# Patient Record
Sex: Male | Born: 1955 | Race: Black or African American | Hispanic: No | Marital: Single | Smoking: Never smoker
Health system: Southern US, Community
[De-identification: ages and names within clinical notes are randomized; demographics above are authoritative.]

## PROBLEM LIST (undated history)

## (undated) DIAGNOSIS — E119 Type 2 diabetes mellitus without complications: Secondary | ICD-10-CM

## (undated) DIAGNOSIS — I10 Essential (primary) hypertension: Secondary | ICD-10-CM

## (undated) HISTORY — PX: OTHER SURGICAL HISTORY: SHX169

---

## 2005-06-07 ENCOUNTER — Emergency Department (HOSPITAL_COMMUNITY): Admission: EM | Admit: 2005-06-07 | Discharge: 2005-06-07 | Payer: Self-pay | Admitting: Emergency Medicine

## 2007-10-06 ENCOUNTER — Emergency Department (HOSPITAL_COMMUNITY): Admission: EM | Admit: 2007-10-06 | Discharge: 2007-10-06 | Payer: Self-pay | Admitting: Emergency Medicine

## 2010-10-07 LAB — CBC
Hemoglobin: 14.4
MCHC: 32.1
Platelets: 149 — ABNORMAL LOW
RDW: 15.8 — ABNORMAL HIGH

## 2010-10-07 LAB — BASIC METABOLIC PANEL
BUN: 8
CO2: 25
Calcium: 8.8
Creatinine, Ser: 1.08
GFR calc non Af Amer: >60
Glucose, Bld: 121 — ABNORMAL HIGH
Sodium: 135

## 2010-10-07 LAB — DIFFERENTIAL
Basophils Absolute: 0
Basophils Relative: 0
Monocytes Absolute: 0.5
Neutro Abs: 15.7 — ABNORMAL HIGH

## 2010-10-07 LAB — RAPID STREP SCREEN (MED CTR MEBANE ONLY): Streptococcus, Group A Screen (Direct): POSITIVE — AB

## 2011-03-24 ENCOUNTER — Encounter: Payer: Self-pay | Admitting: Gastroenterology

## 2011-03-24 ENCOUNTER — Ambulatory Visit (INDEPENDENT_AMBULATORY_CARE_PROVIDER_SITE_OTHER): Payer: Managed Care, Other (non HMO) | Admitting: Gastroenterology

## 2011-03-24 DIAGNOSIS — R1310 Dysphagia, unspecified: Secondary | ICD-10-CM

## 2011-03-24 DIAGNOSIS — K529 Noninfective gastroenteritis and colitis, unspecified: Secondary | ICD-10-CM | POA: Insufficient documentation

## 2011-03-24 DIAGNOSIS — E860 Dehydration: Secondary | ICD-10-CM | POA: Insufficient documentation

## 2011-03-24 DIAGNOSIS — K5289 Other specified noninfective gastroenteritis and colitis: Secondary | ICD-10-CM

## 2011-03-24 NOTE — Patient Instructions (Signed)
We have set you up for an upper endoscopy and colonoscopy to check out your GI tract. Start taking the Prilosec daily, 30 minutes before the first meal of the day. We have provided samples for now.   Please have your blood work repeated on Thursday or Friday of this week. We will send the results to Dr. Renard Matter.   Further recommendations to follow once these procedures are completed.

## 2011-03-24 NOTE — Progress Notes (Signed)
  Referring Provider: Dr. Renard Matter Primary Care Physician:  Alice Reichert, MD, MD Primary Gastroenterologist:  Dr. Jena Gauss   Chief Complaint  Patient presents with  . Dysphagia    HPI:   Melvin Carr is a 56 year old male referred by Dr. Renard Matter secondary to N/V/D. Last Monday, pt experienced acute onset of N/V/D, all of which have resolved now. Was seen in PCP's office last Friday, with BMP showing very mild elevation of BUN/Cr (27 and 1.5). Dr. Renard Matter aware.  Pt reports dysphagia, present prior to acute illness for several weeks. +reflux lasting about 3 days, now improved. Denies abdominal pain. No rectal bleeding. Bowel habits normalized. Does note very small, streaky amount of blood in emesis towards end of week. Notes last week +fever/chills/achy, resolved now.   No prior EGD or TCS.   Past Medical History  Diagnosis Date  . Medical history non-contributory     Past Surgical History  Procedure Date  . None     No current outpatient prescriptions on file.    Allergies as of 03/24/2011  . (No Known Allergies)    Family History  Problem Relation Age of Onset  . Colon cancer Neg Hx     History   Social History  . Marital Status: Single    Spouse Name: N/A    Number of Children: N/A  . Years of Education: N/A   Occupational History  . Truck driver    Social History Main Topics  . Smoking status: Never Smoker   . Smokeless tobacco: Not on file  . Alcohol Use: No  . Drug Use: No  . Sexually Active: Not on file   Other Topics Concern  . Not on file   Social History Narrative  . No narrative on file    Review of Systems: Gen: SEE HPI CV: Denies chest pain, heart palpitations, syncope, peripheral edema. Resp: Denies shortness of breath with rest, cough, wheezing GI: SEE HPI GU : Denies urinary burning, urinary frequency, urinary incontinence.  MS: Denies joint pain, muscle weakness, cramps, limited movement Derm: Denies rash, itching, dry skin Psych:  Denies depression, anxiety, confusion or memory loss  Heme: Denies bruising, bleeding, and enlarged lymph nodes.  Physical Exam: Pulse 74  Temp(Src) 98 F (36.7 C) (Temporal)  Ht 6\' 2"  (1.88 m)  Wt 252 lb 6.4 oz (114.488 kg)  BMI 32.41 kg/m2 General:   Alert and oriented. Well-developed, well-nourished, pleasant and cooperative. Head:  Normocephalic and atraumatic. Eyes:  Conjunctiva pink, sclera clear, no icterus.    Ears:  Normal auditory acuity. Nose:  No deformity, discharge,  or lesions. Mouth:  No deformity or lesions, mucosa pink and moist.  Neck:  Supple, without mass or thyromegaly. Lungs:  Clear to auscultation bilaterally, without wheezing, rales, or rhonchi.  Heart:  S1, S2 present without murmurs noted.  Abdomen:  +BS, soft, non-tender and non-distended. Without mass or HSM. No rebound or guarding. No hernias noted. Rectal:  Deferred  Msk:  Symmetrical without gross deformities. Normal posture. Extremities:  Without clubbing or edema. Neurologic:  Alert and  oriented x4;  grossly normal neurologically. Skin:  Intact, warm and dry without significant lesions or rashes Cervical Nodes:  No significant cervical adenopathy. Psych:  Alert and cooperative. Normal mood and affect.

## 2011-03-24 NOTE — Assessment & Plan Note (Addendum)
56 year old male with new-onset dysphagia for several weeks, not related to acute illness of likely gastroenteritis, which has now resolved. Dysphagia present for several weeks prior to illness. Notes a bout of reflux X 3 days, resolved with Tums. No PPIs currently, no prior hx of upper endoscopy. Likely benign etiology such as esophageal web, ring, or stricture. Unable to exclude GERD as culprit as well. During acute illness of N/V/D, pt did note very scant amount of streaky blood in emesis, resolved. Likely due to repeated vomiting.   ~I have asked Melvin Carr to start taking Prilosec 20 mg daily. He is hesitant to do so. Samples were provided.  ~We will also proceed with an upper EGD due to new-onset dysphagia with Dr. Jena Gauss. The risks, benefits, and alternatives have been discussed in detail with patient. They have stated understanding and desire to proceed.

## 2011-03-26 ENCOUNTER — Other Ambulatory Visit: Payer: Self-pay

## 2011-03-26 DIAGNOSIS — Z139 Encounter for screening, unspecified: Secondary | ICD-10-CM

## 2011-03-26 NOTE — Assessment & Plan Note (Signed)
Related to acute illness, mild BUN/Cr elevation. Repeat BMP end of this week and fax results to PCP for records.

## 2011-03-26 NOTE — Progress Notes (Signed)
Faxed to PCP

## 2011-03-26 NOTE — Assessment & Plan Note (Signed)
Acute N/V/D, fever/chills, now resolved.

## 2011-03-27 ENCOUNTER — Telehealth: Payer: Self-pay

## 2011-03-27 NOTE — Telephone Encounter (Signed)
Pt is scheduled for hsi TCS/EGD/ED with Dr. Jena Gauss on 03/31/2011 @ 12:15 PM. He and his wife are aware he needs to register at [facility] short stay at 11:15 AM. His Rx and instructions were faxed to [facility]. I confirmed with Harrold Donath that it was received.

## 2011-03-28 ENCOUNTER — Telehealth: Payer: Self-pay | Admitting: Gastroenterology

## 2011-03-28 MED ORDER — SODIUM CHLORIDE 0.45 % IV SOLN: Freq: Once | INTRAVENOUS | Status: DC

## 2011-03-28 NOTE — Telephone Encounter (Signed)
LMOVM with new arrival & procedure time - pt needs to arrive at [facility] @ 10:25 on 03/25

## 2011-03-31 ENCOUNTER — Telehealth: Payer: Self-pay

## 2011-03-31 NOTE — Telephone Encounter (Signed)
LMOM for pt that he has been rescheduled for 04/23/2011 @ 10:30 AM and will need to be at the hospital at 9:30 AM. Call if any questions. LMOM for Kim the new appt.

## 2011-03-31 NOTE — Telephone Encounter (Signed)
Per Crystal, pt left VM that he had death in his family and needed to cancel his colonoscopy for today and will reschedule. LMOM for Kim. I will call pt later today and reschedule.

## 2011-04-01 NOTE — Telephone Encounter (Signed)
Mailed pt a letter with new date and time.

## 2011-04-07 LAB — BASIC METABOLIC PANEL
BUN: 27 mg/dL — AB (ref 4–21)
Creat: 1.51
Potassium: 4.3 mmol/L

## 2011-04-10 NOTE — Progress Notes (Signed)
Called and confirmed with pt's wife that he received his new date and time for his colonoscopy. It is scheduled for 04/23/2011 @ 10:15 AM.

## 2011-04-23 ENCOUNTER — Encounter (HOSPITAL_COMMUNITY): Admission: RE | Payer: Self-pay | Source: Ambulatory Visit

## 2011-04-23 ENCOUNTER — Ambulatory Visit (HOSPITAL_COMMUNITY)
Admission: RE | Admit: 2011-04-23 | Payer: Managed Care, Other (non HMO) | Source: Ambulatory Visit | Admitting: Internal Medicine

## 2011-04-23 SURGERY — COLONOSCOPY
Anesthesia: Moderate Sedation

## 2016-03-21 DIAGNOSIS — Z6834 Body mass index (BMI) 34.0-34.9, adult: Secondary | ICD-10-CM | POA: Diagnosis not present

## 2016-03-21 DIAGNOSIS — I10 Essential (primary) hypertension: Secondary | ICD-10-CM | POA: Diagnosis not present

## 2016-03-21 DIAGNOSIS — R7301 Impaired fasting glucose: Secondary | ICD-10-CM | POA: Diagnosis not present

## 2016-04-30 DIAGNOSIS — I10 Essential (primary) hypertension: Secondary | ICD-10-CM | POA: Diagnosis not present

## 2016-04-30 DIAGNOSIS — E785 Hyperlipidemia, unspecified: Secondary | ICD-10-CM | POA: Diagnosis not present

## 2016-04-30 DIAGNOSIS — R7301 Impaired fasting glucose: Secondary | ICD-10-CM | POA: Diagnosis not present

## 2016-04-30 DIAGNOSIS — Z125 Encounter for screening for malignant neoplasm of prostate: Secondary | ICD-10-CM | POA: Diagnosis not present

## 2016-05-02 DIAGNOSIS — E1169 Type 2 diabetes mellitus with other specified complication: Secondary | ICD-10-CM | POA: Diagnosis not present

## 2016-05-02 DIAGNOSIS — I10 Essential (primary) hypertension: Secondary | ICD-10-CM | POA: Diagnosis not present

## 2016-05-02 DIAGNOSIS — R358 Other polyuria: Secondary | ICD-10-CM | POA: Diagnosis not present

## 2016-05-02 DIAGNOSIS — E785 Hyperlipidemia, unspecified: Secondary | ICD-10-CM | POA: Diagnosis not present

## 2016-07-30 DIAGNOSIS — E1165 Type 2 diabetes mellitus with hyperglycemia: Secondary | ICD-10-CM | POA: Diagnosis not present

## 2016-07-30 DIAGNOSIS — E785 Hyperlipidemia, unspecified: Secondary | ICD-10-CM | POA: Diagnosis not present

## 2016-08-05 DIAGNOSIS — E1169 Type 2 diabetes mellitus with other specified complication: Secondary | ICD-10-CM | POA: Diagnosis not present

## 2016-08-05 DIAGNOSIS — E785 Hyperlipidemia, unspecified: Secondary | ICD-10-CM | POA: Diagnosis not present

## 2016-08-05 DIAGNOSIS — R718 Other abnormality of red blood cells: Secondary | ICD-10-CM | POA: Diagnosis not present

## 2016-08-05 DIAGNOSIS — I10 Essential (primary) hypertension: Secondary | ICD-10-CM | POA: Diagnosis not present

## 2017-06-19 ENCOUNTER — Emergency Department (HOSPITAL_COMMUNITY)
Admission: EM | Admit: 2017-06-19 | Discharge: 2017-06-19 | Disposition: A | Discharge status: Home / Self Care | Payer: No Typology Code available for payment source | Attending: Emergency Medicine | Admitting: Emergency Medicine

## 2017-06-19 ENCOUNTER — Other Ambulatory Visit: Payer: Self-pay

## 2017-06-19 ENCOUNTER — Emergency Department (HOSPITAL_COMMUNITY): Payer: No Typology Code available for payment source

## 2017-06-19 ENCOUNTER — Encounter (HOSPITAL_COMMUNITY): Payer: Self-pay | Admitting: Emergency Medicine

## 2017-06-19 DIAGNOSIS — Y92410 Unspecified street and highway as the place of occurrence of the external cause: Secondary | ICD-10-CM | POA: Insufficient documentation

## 2017-06-19 DIAGNOSIS — Y9389 Activity, other specified: Secondary | ICD-10-CM | POA: Insufficient documentation

## 2017-06-19 DIAGNOSIS — Y999 Unspecified external cause status: Secondary | ICD-10-CM | POA: Insufficient documentation

## 2017-06-19 DIAGNOSIS — R1030 Lower abdominal pain, unspecified: Secondary | ICD-10-CM | POA: Diagnosis not present

## 2017-06-19 DIAGNOSIS — R911 Solitary pulmonary nodule: Secondary | ICD-10-CM | POA: Insufficient documentation

## 2017-06-19 DIAGNOSIS — M545 Low back pain, unspecified: Secondary | ICD-10-CM

## 2017-06-19 DIAGNOSIS — E1165 Type 2 diabetes mellitus with hyperglycemia: Secondary | ICD-10-CM | POA: Insufficient documentation

## 2017-06-19 DIAGNOSIS — R7989 Other specified abnormal findings of blood chemistry: Secondary | ICD-10-CM | POA: Diagnosis not present

## 2017-06-19 DIAGNOSIS — I10 Essential (primary) hypertension: Secondary | ICD-10-CM | POA: Diagnosis not present

## 2017-06-19 DIAGNOSIS — M542 Cervicalgia: Secondary | ICD-10-CM | POA: Diagnosis not present

## 2017-06-19 DIAGNOSIS — R739 Hyperglycemia, unspecified: Secondary | ICD-10-CM

## 2017-06-19 HISTORY — DX: Type 2 diabetes mellitus without complications: E11.9

## 2017-06-19 HISTORY — DX: Essential (primary) hypertension: I10

## 2017-06-19 LAB — URINALYSIS, ROUTINE W REFLEX MICROSCOPIC
BACTERIA UA: NONE SEEN
BILIRUBIN URINE: NEGATIVE
Glucose, UA: >=500 mg/dL — AB
HGB URINE DIPSTICK: NEGATIVE
KETONES UR: NEGATIVE mg/dL
LEUKOCYTES UA: NEGATIVE
NITRITE: NEGATIVE
PROTEIN: NEGATIVE mg/dL
SPECIFIC GRAVITY, URINE: 1.022 (ref 1.005–1.030)
pH: 6 (ref 5.0–8.0)

## 2017-06-19 LAB — CBC WITH DIFFERENTIAL/PLATELET
BASOS ABS: 0 10*3/uL (ref 0.0–0.1)
BASOS PCT: 0 %
EOS ABS: 0.2 10*3/uL (ref 0.0–0.7)
Eosinophils Relative: 3 %
HCT: 42 % (ref 39.0–52.0)
HEMOGLOBIN: 13.3 g/dL (ref 13.0–17.0)
Lymphocytes Relative: 28 %
Lymphs Abs: 1.7 10*3/uL (ref 0.7–4.0)
MCH: 23.5 pg — ABNORMAL LOW (ref 26.0–34.0)
MCHC: 31.7 g/dL (ref 30.0–36.0)
MCV: 74.3 fL — ABNORMAL LOW (ref 78.0–100.0)
MONO ABS: 0.3 10*3/uL (ref 0.1–1.0)
MONOS PCT: 5 %
NEUTROS PCT: 64 %
Neutro Abs: 3.8 10*3/uL (ref 1.7–7.7)
Platelets: 190 10*3/uL (ref 150–400)
RBC: 5.65 MIL/uL (ref 4.22–5.81)
RDW: 15.9 % — AB (ref 11.5–15.5)
WBC: 5.9 10*3/uL (ref 4.0–10.5)

## 2017-06-19 LAB — LIPASE, BLOOD: Lipase: 80 U/L — ABNORMAL HIGH (ref 11–51)

## 2017-06-19 LAB — COMPREHENSIVE METABOLIC PANEL
ALBUMIN: 3.8 g/dL (ref 3.5–5.0)
ALT: 19 U/L (ref 17–63)
ANION GAP: 8 (ref 5–15)
AST: 18 U/L (ref 15–41)
Alkaline Phosphatase: 48 U/L (ref 38–126)
BUN: 15 mg/dL (ref 6–20)
CHLORIDE: 100 mmol/L — AB (ref 101–111)
CO2: 29 mmol/L (ref 22–32)
Calcium: 9.2 mg/dL (ref 8.9–10.3)
Creatinine, Ser: 1.48 mg/dL — ABNORMAL HIGH (ref 0.61–1.24)
GFR calc Af Amer: 58 mL/min — ABNORMAL LOW (ref >60)
GFR calc non Af Amer: 50 mL/min — ABNORMAL LOW (ref >60)
GLUCOSE: 261 mg/dL — AB (ref 65–99)
POTASSIUM: 4.2 mmol/L (ref 3.5–5.1)
SODIUM: 137 mmol/L (ref 135–145)
Total Bilirubin: 0.4 mg/dL (ref 0.3–1.2)
Total Protein: 7.1 g/dL (ref 6.5–8.1)

## 2017-06-19 LAB — CBG MONITORING, ED: GLUCOSE-CAPILLARY: 218 mg/dL — AB (ref 65–99)

## 2017-06-19 MED ORDER — CYCLOBENZAPRINE HCL 10 MG PO TABS: 10.0000 mg | ORAL_TABLET | Freq: Two times a day (BID) | ORAL | 0 refills | Status: DC | PRN

## 2017-06-19 MED ORDER — IOPAMIDOL (ISOVUE-300) INJECTION 61%
100.0000 mL | Freq: Once | INTRAVENOUS | Status: AC | PRN
  Administered 2017-06-19: 100 mL via INTRAVENOUS

## 2017-06-19 MED ORDER — SODIUM CHLORIDE 0.9 % IV BOLUS
1000.0000 mL | Freq: Once | INTRAVENOUS | Status: AC
  Administered 2017-06-19: 1000 mL via INTRAVENOUS

## 2017-06-19 MED ORDER — ACETAMINOPHEN 500 MG PO TABS
1000.0000 mg | ORAL_TABLET | Freq: Once | ORAL | Status: AC
  Administered 2017-06-19: 1000 mg via ORAL
  Filled 2017-06-19: qty 2
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
## 2017-06-19 NOTE — Discharge Instructions (Addendum)
Your work-up is overall been reassuring, imaging does not show any signs of acute injury.  Lab abnormalities likely related to chronic hypertension and diabetes, you will need to follow-up with primary care for continued management of these.  Labs: Elevated blood sugar, elevated kidney function, slight elevation in lipase  Imaging findings: Chronic degenerative changes of the cervical and lumbar spine.  Chest x-ray and thoracic spine look good. 2 mm right middle lobe pulmonary nodule. Non-contrast chest CT can be considered in 12 months.  The pain your experiencing is likely due to muscle strain, you may take tylenol and flexeril as needed for pain management. Do not combine with any pain reliever. The muscle soreness should improve over the next week. Follow up with primary care using the resources provided in the next week for a recheck if you are still having symptoms. Return to ED if pain is worsening, you develop weakness or numbness of extremities, or new or concerning symptoms develop.  Your blood pressure and blood sugar were elevated today, please continue taking your lisinopril daily, please [facility] for continued management of your diabetes they can help get you back on medication that is more affordable.  [facility] [place] Primary [facility] Doctor List    Kari BaarsEdward Hawkins MD. Specialty: Pulmonary Disease Contact information: [address]  [address]  [place] [address]  [phone]   Syliva OvermanMargaret Simpson, MD. Specialty: [facility] Contact information: [address]  [place] [address]  [phone]   Lilyan PuntScott Luking, MD. Specialty: [facility] Contact information: [address]  [address]  [place] [address]  [phone]   Avon Gullyesfaye Fanta, MD Specialty: Internal Medicine Contact information: [address]  [place] [address]  [phone]   Catalina PizzaZach Hall, MD. Specialty: Internal Medicine Contact information: [address]    [place] [address]  [phone]    [facility] (Dr. Selena BattenKim) Specialty: Family Medicine Contact information: [address]  [place] [address]  [phone]   John GiovanniStephen Knowlton, MD. Specialty: [facility] Contact information: [address]  [address]  [place] [address]  [phone]   Carylon Perchesoy Fagan, MD. Specialty: Internal Medicine Contact information: [address]  [address]  [place] [address]  [phone]    [facility]  [address] [place] [address] [phone]  Services [facility] offers a variety of basic health services.  Services include but are not limited to: Blood pressure checks  Heart rate checks  Blood sugar checks  Urine analysis  Rapid strep tests  Pregnancy tests.  Health education and referrals  People needing more complex services will be directed to a physician online. Using these virtual visits, doctors can evaluate and prescribe medicine and treatments. There will be no medication on-site, though [facility] will help patients fill their prescriptions at little to no cost.   For More information please go to: [email or web address]

## 2018-01-12 DIAGNOSIS — I10 Essential (primary) hypertension: Secondary | ICD-10-CM | POA: Diagnosis not present

## 2018-01-12 DIAGNOSIS — R319 Hematuria, unspecified: Secondary | ICD-10-CM | POA: Diagnosis not present

## 2018-01-12 DIAGNOSIS — Z125 Encounter for screening for malignant neoplasm of prostate: Secondary | ICD-10-CM | POA: Diagnosis not present

## 2018-01-12 DIAGNOSIS — E1169 Type 2 diabetes mellitus with other specified complication: Secondary | ICD-10-CM | POA: Diagnosis not present

## 2018-01-12 DIAGNOSIS — Z113 Encounter for screening for infections with a predominantly sexual mode of transmission: Secondary | ICD-10-CM | POA: Diagnosis not present

## 2018-01-12 DIAGNOSIS — Z1159 Encounter for screening for other viral diseases: Secondary | ICD-10-CM | POA: Diagnosis not present

## 2018-01-12 DIAGNOSIS — R809 Proteinuria, unspecified: Secondary | ICD-10-CM | POA: Diagnosis not present

## 2018-06-19 DIAGNOSIS — Z0000 Encounter for general adult medical examination without abnormal findings: Secondary | ICD-10-CM | POA: Diagnosis not present

## 2018-06-19 DIAGNOSIS — I10 Essential (primary) hypertension: Secondary | ICD-10-CM | POA: Diagnosis not present

## 2018-06-19 DIAGNOSIS — E1169 Type 2 diabetes mellitus with other specified complication: Secondary | ICD-10-CM | POA: Diagnosis not present

## 2018-09-06 DIAGNOSIS — E1169 Type 2 diabetes mellitus with other specified complication: Secondary | ICD-10-CM | POA: Diagnosis not present

## 2018-09-06 DIAGNOSIS — I10 Essential (primary) hypertension: Secondary | ICD-10-CM | POA: Diagnosis not present

## 2018-10-01 DIAGNOSIS — I10 Essential (primary) hypertension: Secondary | ICD-10-CM | POA: Diagnosis not present

## 2018-10-01 DIAGNOSIS — E1165 Type 2 diabetes mellitus with hyperglycemia: Secondary | ICD-10-CM | POA: Diagnosis not present

## 2018-10-01 DIAGNOSIS — Z6836 Body mass index (BMI) 36.0-36.9, adult: Secondary | ICD-10-CM | POA: Diagnosis not present

## 2018-11-04 DIAGNOSIS — R319 Hematuria, unspecified: Secondary | ICD-10-CM | POA: Diagnosis not present

## 2018-11-04 DIAGNOSIS — I10 Essential (primary) hypertension: Secondary | ICD-10-CM | POA: Diagnosis not present

## 2018-11-04 DIAGNOSIS — Z113 Encounter for screening for infections with a predominantly sexual mode of transmission: Secondary | ICD-10-CM | POA: Diagnosis not present

## 2018-11-04 DIAGNOSIS — Z0000 Encounter for general adult medical examination without abnormal findings: Secondary | ICD-10-CM | POA: Diagnosis not present

## 2018-11-04 DIAGNOSIS — R310 Gross hematuria: Secondary | ICD-10-CM | POA: Diagnosis not present

## 2018-11-04 DIAGNOSIS — E1165 Type 2 diabetes mellitus with hyperglycemia: Secondary | ICD-10-CM | POA: Diagnosis not present

## 2018-11-04 DIAGNOSIS — R809 Proteinuria, unspecified: Secondary | ICD-10-CM | POA: Diagnosis not present

## 2018-11-04 DIAGNOSIS — Z6836 Body mass index (BMI) 36.0-36.9, adult: Secondary | ICD-10-CM | POA: Diagnosis not present

## 2018-11-04 DIAGNOSIS — Z7251 High risk heterosexual behavior: Secondary | ICD-10-CM | POA: Diagnosis not present

## 2019-01-11 ENCOUNTER — Ambulatory Visit: Payer: Self-pay | Admitting: Urology

## 2019-04-04 ENCOUNTER — Other Ambulatory Visit: Payer: Self-pay

## 2019-04-04 ENCOUNTER — Encounter (HOSPITAL_COMMUNITY): Payer: Self-pay | Admitting: Emergency Medicine

## 2019-04-04 ENCOUNTER — Emergency Department (HOSPITAL_COMMUNITY): Payer: BC Managed Care – PPO

## 2019-04-04 ENCOUNTER — Emergency Department (HOSPITAL_COMMUNITY)
Admission: EM | Admit: 2019-04-04 | Discharge: 2019-04-04 | Disposition: A | Discharge status: Home / Self Care | Payer: BC Managed Care – PPO | Attending: Emergency Medicine | Admitting: Emergency Medicine

## 2019-04-04 DIAGNOSIS — E119 Type 2 diabetes mellitus without complications: Secondary | ICD-10-CM | POA: Diagnosis not present

## 2019-04-04 DIAGNOSIS — R1030 Lower abdominal pain, unspecified: Secondary | ICD-10-CM | POA: Insufficient documentation

## 2019-04-04 DIAGNOSIS — I10 Essential (primary) hypertension: Secondary | ICD-10-CM | POA: Diagnosis not present

## 2019-04-04 DIAGNOSIS — R300 Dysuria: Secondary | ICD-10-CM | POA: Insufficient documentation

## 2019-04-04 DIAGNOSIS — R509 Fever, unspecified: Secondary | ICD-10-CM | POA: Insufficient documentation

## 2019-04-04 DIAGNOSIS — K5730 Diverticulosis of large intestine without perforation or abscess without bleeding: Secondary | ICD-10-CM | POA: Diagnosis not present

## 2019-04-04 LAB — LACTIC ACID, PLASMA: Lactic Acid, Venous: 1.2 mmol/L (ref 0.5–1.9)

## 2019-04-04 LAB — CBC WITH DIFFERENTIAL/PLATELET
Abs Immature Granulocytes: 0.06 10*3/uL (ref 0.00–0.07)
Basophils Absolute: 0 10*3/uL (ref 0.0–0.1)
Basophils Relative: 0 %
Eosinophils Absolute: 0.1 10*3/uL (ref 0.0–0.5)
Eosinophils Relative: 1 %
HCT: 43 % (ref 39.0–52.0)
Hemoglobin: 13.5 g/dL (ref 13.0–17.0)
Immature Granulocytes: 0 %
Lymphocytes Relative: 9 %
Lymphs Abs: 1.3 10*3/uL (ref 0.7–4.0)
MCH: 23.5 pg — ABNORMAL LOW (ref 26.0–34.0)
MCHC: 31.4 g/dL (ref 30.0–36.0)
MCV: 74.9 fL — ABNORMAL LOW (ref 80.0–100.0)
Monocytes Absolute: 0.8 10*3/uL (ref 0.1–1.0)
Monocytes Relative: 6 %
Neutro Abs: 12.3 10*3/uL — ABNORMAL HIGH (ref 1.7–7.7)
Neutrophils Relative %: 84 %
Platelets: 213 10*3/uL (ref 150–400)
RBC: 5.74 MIL/uL (ref 4.22–5.81)
RDW: 15.4 % (ref 11.5–15.5)
WBC: 14.6 10*3/uL — ABNORMAL HIGH (ref 4.0–10.5)
nRBC: 0 % (ref 0.0–0.2)

## 2019-04-04 LAB — URINALYSIS, ROUTINE W REFLEX MICROSCOPIC
Bacteria, UA: NONE SEEN
Bilirubin Urine: NEGATIVE
Glucose, UA: 150 mg/dL — AB
Ketones, ur: NEGATIVE mg/dL
Nitrite: NEGATIVE
Protein, ur: 100 mg/dL — AB
Specific Gravity, Urine: 1.026 (ref 1.005–1.030)
pH: 5 (ref 5.0–8.0)

## 2019-04-04 LAB — BASIC METABOLIC PANEL
Anion gap: 10 (ref 5–15)
BUN: 13 mg/dL (ref 8–23)
CO2: 25 mmol/L (ref 22–32)
Calcium: 8.8 mg/dL — ABNORMAL LOW (ref 8.9–10.3)
Chloride: 96 mmol/L — ABNORMAL LOW (ref 98–111)
Creatinine, Ser: 1.11 mg/dL (ref 0.61–1.24)
GFR calc Af Amer: >60 mL/min (ref >60)
GFR calc non Af Amer: >60 mL/min (ref >60)
Glucose, Bld: 258 mg/dL — ABNORMAL HIGH (ref 70–99)
Potassium: 3.9 mmol/L (ref 3.5–5.1)
Sodium: 131 mmol/L — ABNORMAL LOW (ref 135–145)

## 2019-04-04 MED ORDER — SODIUM CHLORIDE 0.9 % IV BOLUS: 1000.0000 mL | Freq: Once | INTRAVENOUS | Status: DC

## 2019-04-04 MED ORDER — SODIUM CHLORIDE 0.9 % IV BOLUS
1000.0000 mL | Freq: Once | INTRAVENOUS | Status: AC
  Administered 2019-04-04: 1000 mL via INTRAVENOUS

## 2019-04-04 MED ORDER — ACETAMINOPHEN 500 MG PO TABS
1000.0000 mg | ORAL_TABLET | Freq: Once | ORAL | Status: AC
Start: 2019-04-04 — End: 2019-04-04
  Administered 2019-04-04: 1000 mg via ORAL
  Filled 2019-04-04: qty 2

## 2019-04-04 MED ORDER — SULFAMETHOXAZOLE-TRIMETHOPRIM 800-160 MG PO TABS
1.0000 | ORAL_TABLET | Freq: Once | ORAL | Status: AC
  Administered 2019-04-04: 1 via ORAL
  Filled 2019-04-04: qty 1

## 2019-04-04 MED ORDER — SULFAMETHOXAZOLE-TRIMETHOPRIM 800-160 MG PO TABS: 1.0000 | ORAL_TABLET | Freq: Two times a day (BID) | ORAL | 0 refills | Status: AC

## 2019-04-04 NOTE — ED Provider Notes (Signed)
Blood pressure (!) 144/98, pulse (!) 103, temperature 100.1 F (37.8 C), temperature source Oral, resp. rate 20, height 6\' 2"  (1.88 m), weight 121.6 kg, SpO2 96 %.  Assuming care from Dr. .  In short, Melvin Carr is a 64 y.o. male with a chief complaint of Dysuria .  Refer to the original H&P for additional details.  The current plan of care is to f/u on labs and CT.  CT imaging with no acute findings. Radiology read reviewed. UA pending.  09:33 AM   patient with trace leukocytes and 11-20 WBCs.  No bacteria or nitrites seen on UA.  He is having significant symptoms with some inflammation noted on CT in the bladder area.  No concern clinically for pyelonephritis.  Plan to cover given his symptoms with Bactrim and have him follow closely with his primary care doctor.  I discussed emergency department return precautions in detail with the patient and he is comfortable with the plan at discharge.  We will give his first dose of antibiotic here.   77, MD 04/04/19 [phone]

## 2019-04-04 NOTE — ED Triage Notes (Signed)
Pt C/O dysuria and bilateral lower abdominal abdominal pain. Pt reports fevers at home.

## 2019-04-04 NOTE — Discharge Instructions (Signed)
You were seen in the emergency department today with chills and pain with urination.  There is some evidence of a urine infection on your UA.  Your CT scan looks largely normal.  I am starting you on antibiotics given your symptoms but if you feel significantly worse, have worsening pain, or vomiting which keeps you from taking your antibiotics you should return to the emergency department for reevaluation.  Please call your primary care doctor today to schedule the next available follow-up appointment to make sure your symptoms are improving.

## 2019-04-04 NOTE — ED Provider Notes (Signed)
[facility] EMERGENCY DEPARTMENT Provider Note   CSN: [id number] Arrival date & time: 04/04/19  7262     History Chief Complaint  Patient presents with  . Dysuria    Melvin Carr is a 64 y.o. male.  Patient with history of diabetes, high blood pressure presents with worsening bilateral lower abdominal discomfort, significant dysuria and subjective fevers at home over the weekend.  No history of similar.  No history of prostate issues or urine infections.  No new sexual partners.  No history of kidney stones.  Patient started feel chills and generally unwell.        Past Medical History:  Diagnosis Date  . Diabetes mellitus without complication ([place])   . Hypertension     Patient Active Problem List   Diagnosis Date Noted  . Dehydration 03/24/2011  . Dysphagia 03/24/2011  . Gastroenteritis 03/24/2011    Past Surgical History:  Procedure Laterality Date  . None         Family History  Problem Relation Age of Onset  . Colon cancer Neg Hx     Social History   Tobacco Use  . Smoking status: Never Smoker  . Smokeless tobacco: Never Used  Substance Use Topics  . Alcohol use: No  . Drug use: No    Home Medications Prior to Admission medications   Medication Sig Start Date End Date Taking? Authorizing Provider  cyclobenzaprine (FLEXERIL) 10 MG tablet Take 1 tablet (10 mg total) by mouth 2 (two) times daily as needed for muscle spasms. 06/19/17   Jacqlyn Larsen, PA-C    Allergies    Patient has no known allergies.  Review of Systems   Review of Systems  Constitutional: Positive for appetite change, chills and fever.  HENT: Negative for congestion.   Eyes: Negative for visual disturbance.  Respiratory: Negative for shortness of breath.   Cardiovascular: Negative for chest pain.  Gastrointestinal: Positive for abdominal pain and nausea. Negative for vomiting.  Genitourinary: Negative for dysuria and flank pain.  Musculoskeletal: Negative for back pain,  neck pain and neck stiffness.  Skin: Negative for rash.  Neurological: Positive for light-headedness. Negative for headaches.    Physical Exam Updated Vital Signs BP (!) 137/115   Pulse (!) 120   Temp 100.1 F (37.8 C) (Oral)   Resp 20   Ht 6\' 2"  (1.88 m)   Wt 121.6 kg   SpO2 98%   BMI 34.41 kg/m   Physical Exam Vitals and nursing note reviewed.  Constitutional:      Appearance: He is well-developed.  HENT:     Head: Normocephalic and atraumatic.     Mouth/Throat:     Mouth: Mucous membranes are dry.  Eyes:     General:        Right eye: No discharge.        Left eye: No discharge.     Conjunctiva/sclera: Conjunctivae normal.  Neck:     Trachea: No tracheal deviation.  Cardiovascular:     Rate and Rhythm: Regular rhythm. Tachycardia present.  Pulmonary:     Effort: Pulmonary effort is normal.     Breath sounds: Normal breath sounds.  Abdominal:     General: There is no distension.     Palpations: Abdomen is soft.     Tenderness: There is abdominal tenderness (suprapubic). There is no guarding.  Genitourinary:    Comments: Testicles nontender, no external lesions, no swelling or hernia appreciated.  No rashes.  Prostate no significant tenderness.  Musculoskeletal:     Cervical back: Normal range of motion and neck supple.  Skin:    General: Skin is warm.     Findings: No rash.  Neurological:     General: No focal deficit present.     Mental Status: He is alert and oriented to person, place, and time.  Psychiatric:        Mood and Affect: Mood normal.     ED Results / Procedures / Treatments   Labs (all labs ordered are listed, but only abnormal results are displayed) Labs Reviewed  URINE CULTURE  BASIC METABOLIC PANEL  CBC WITH DIFFERENTIAL/PLATELET  URINALYSIS, ROUTINE W REFLEX MICROSCOPIC  LACTIC ACID, PLASMA  LACTIC ACID, PLASMA    EKG None  Radiology No results found.  Procedures Procedures (including critical care time)  Medications  Ordered in ED Medications  sodium chloride 0.9 % bolus 1,000 mL (has no administration in time range)  acetaminophen (TYLENOL) tablet 1,000 mg (has no administration in time range)    ED Course  I have reviewed the triage vital signs and the nursing notes.  Pertinent labs & imaging results that were available during my care of the patient were reviewed by me and considered in my medical decision making (see chart for details).    MDM Rules/Calculators/A&P                      Patient presents with generally feeling unwell, dysuria, lower abdominal pain and fever.  With chills and worsening symptoms plan for sepsis screening with likely infectious source being urine.  IV fluids, blood work, urine culture and urinalysis ordered.  Patient care be signed out to follow-up results and reassess for final disposition.   Final Clinical Impression(s) / ED Diagnoses Final diagnoses:  Dysuria    Rx / DC Orders ED Discharge Orders    None       Blane Ohara, MD 04/04/19 [phone]

## 2019-04-05 LAB — URINE CULTURE: Culture: NO GROWTH

## 2019-07-12 DIAGNOSIS — N10 Acute tubulo-interstitial nephritis: Secondary | ICD-10-CM | POA: Diagnosis not present

## 2019-07-12 DIAGNOSIS — R319 Hematuria, unspecified: Secondary | ICD-10-CM | POA: Diagnosis not present

## 2019-07-12 DIAGNOSIS — R809 Proteinuria, unspecified: Secondary | ICD-10-CM | POA: Diagnosis not present

## 2019-07-15 ENCOUNTER — Emergency Department (HOSPITAL_COMMUNITY)
Admission: EM | Admit: 2019-07-15 | Discharge: 2019-07-15 | Disposition: A | Discharge status: Home / Self Care | Payer: BC Managed Care – PPO | Attending: Emergency Medicine | Admitting: Emergency Medicine

## 2019-07-15 ENCOUNTER — Other Ambulatory Visit: Payer: Self-pay

## 2019-07-15 ENCOUNTER — Encounter (HOSPITAL_COMMUNITY): Payer: Self-pay

## 2019-07-15 ENCOUNTER — Emergency Department (HOSPITAL_COMMUNITY): Payer: BC Managed Care – PPO

## 2019-07-15 DIAGNOSIS — E119 Type 2 diabetes mellitus without complications: Secondary | ICD-10-CM | POA: Insufficient documentation

## 2019-07-15 DIAGNOSIS — Z7984 Long term (current) use of oral hypoglycemic drugs: Secondary | ICD-10-CM | POA: Insufficient documentation

## 2019-07-15 DIAGNOSIS — B9789 Other viral agents as the cause of diseases classified elsewhere: Secondary | ICD-10-CM | POA: Diagnosis not present

## 2019-07-15 DIAGNOSIS — Z20822 Contact with and (suspected) exposure to covid-19: Secondary | ICD-10-CM | POA: Insufficient documentation

## 2019-07-15 DIAGNOSIS — J069 Acute upper respiratory infection, unspecified: Secondary | ICD-10-CM | POA: Insufficient documentation

## 2019-07-15 DIAGNOSIS — I10 Essential (primary) hypertension: Secondary | ICD-10-CM | POA: Insufficient documentation

## 2019-07-15 DIAGNOSIS — Z79899 Other long term (current) drug therapy: Secondary | ICD-10-CM | POA: Diagnosis not present

## 2019-07-15 DIAGNOSIS — R05 Cough: Secondary | ICD-10-CM | POA: Diagnosis not present

## 2019-07-15 LAB — CBC WITH DIFFERENTIAL/PLATELET
Abs Immature Granulocytes: 0.03 10*3/uL (ref 0.00–0.07)
Basophils Absolute: 0 10*3/uL (ref 0.0–0.1)
Basophils Relative: 0 %
Eosinophils Absolute: 0.1 10*3/uL (ref 0.0–0.5)
Eosinophils Relative: 1 %
HCT: 40.5 % (ref 39.0–52.0)
Hemoglobin: 12.9 g/dL — ABNORMAL LOW (ref 13.0–17.0)
Immature Granulocytes: 0 %
Lymphocytes Relative: 15 %
Lymphs Abs: 1 10*3/uL (ref 0.7–4.0)
MCH: 23.2 pg — ABNORMAL LOW (ref 26.0–34.0)
MCHC: 31.9 g/dL (ref 30.0–36.0)
MCV: 72.8 fL — ABNORMAL LOW (ref 80.0–100.0)
Monocytes Absolute: 0.9 10*3/uL (ref 0.1–1.0)
Monocytes Relative: 14 %
Neutro Abs: 4.8 10*3/uL (ref 1.7–7.7)
Neutrophils Relative %: 70 %
Platelets: 205 10*3/uL (ref 150–400)
RBC: 5.56 MIL/uL (ref 4.22–5.81)
RDW: 15 % (ref 11.5–15.5)
WBC: 6.9 10*3/uL (ref 4.0–10.5)
nRBC: 0 % (ref 0.0–0.2)

## 2019-07-15 LAB — COMPREHENSIVE METABOLIC PANEL
ALT: 36 U/L (ref 0–44)
AST: 36 U/L (ref 15–41)
Albumin: 3.2 g/dL — ABNORMAL LOW (ref 3.5–5.0)
Alkaline Phosphatase: 54 U/L (ref 38–126)
Anion gap: 13 (ref 5–15)
BUN: 17 mg/dL (ref 8–23)
CO2: 25 mmol/L (ref 22–32)
Calcium: 8.5 mg/dL — ABNORMAL LOW (ref 8.9–10.3)
Chloride: 90 mmol/L — ABNORMAL LOW (ref 98–111)
Creatinine, Ser: 1.23 mg/dL (ref 0.61–1.24)
GFR calc Af Amer: >60 mL/min (ref >60)
GFR calc non Af Amer: >60 mL/min (ref >60)
Glucose, Bld: 266 mg/dL — ABNORMAL HIGH (ref 70–99)
Potassium: 3.6 mmol/L (ref 3.5–5.1)
Sodium: 128 mmol/L — ABNORMAL LOW (ref 135–145)
Total Bilirubin: 0.4 mg/dL (ref 0.3–1.2)
Total Protein: 7.5 g/dL (ref 6.5–8.1)

## 2019-07-15 LAB — URINALYSIS, ROUTINE W REFLEX MICROSCOPIC
Bacteria, UA: NONE SEEN
Bilirubin Urine: NEGATIVE
Glucose, UA: >=500 mg/dL — AB
Ketones, ur: NEGATIVE mg/dL
Leukocytes,Ua: NEGATIVE
Nitrite: NEGATIVE
Protein, ur: 100 mg/dL — AB
Specific Gravity, Urine: 1.023 (ref 1.005–1.030)
pH: 5 (ref 5.0–8.0)

## 2019-07-15 LAB — LACTIC ACID, PLASMA: Lactic Acid, Venous: 1.9 mmol/L (ref 0.5–1.9)

## 2019-07-15 LAB — SARS CORONAVIRUS 2 BY RT PCR (HOSPITAL ORDER, PERFORMED IN ~~LOC~~ HOSPITAL LAB): SARS Coronavirus 2: NEGATIVE

## 2019-07-15 MED ORDER — BENZONATATE 100 MG PO CAPS
100.0000 mg | ORAL_CAPSULE | Freq: Once | ORAL | Status: AC
  Administered 2019-07-15: 100 mg via ORAL
  Filled 2019-07-15: qty 1

## 2019-07-15 MED ORDER — ACETAMINOPHEN 325 MG PO TABS
650.0000 mg | ORAL_TABLET | Freq: Once | ORAL | Status: AC
  Administered 2019-07-15: 650 mg via ORAL
  Filled 2019-07-15: qty 2

## 2019-07-15 MED ORDER — BENZONATATE 100 MG PO CAPS
100.0000 mg | ORAL_CAPSULE | Freq: Three times a day (TID) | ORAL | 0 refills | Status: DC
Start: 2019-07-15 — End: 2021-04-23

## 2019-07-15 MED ORDER — SODIUM CHLORIDE 0.9 % IV BOLUS
1000.0000 mL | Freq: Once | INTRAVENOUS | Status: AC
  Administered 2019-07-15: 1000 mL via INTRAVENOUS

## 2019-07-15 NOTE — ED Provider Notes (Signed)
[facility] EMERGENCY DEPARTMENT Provider Note   CSN: [id number] Arrival date & time: 07/15/19  1041     History Chief Complaint  Patient presents with  . Cough    Melvin Carr is a 64 y.o. male.  HPI   63yM with cough. Not feeling well since last Friday. Fatigued. Body aches. Went to PCP. Diagnosed with kidney infection and started on levaquin that he has been taking since Tuesday. Has since developed a cough. Occasionally coughing to the point of gagging. Doesn't feel SOB. NO documented fever. No HA. No rash.   Past Medical History:  Diagnosis Date  . Diabetes mellitus without complication ([facility])   . Hypertension     Patient Active Problem List   Diagnosis Date Noted  . Dehydration 03/24/2011  . Dysphagia 03/24/2011  . Gastroenteritis 03/24/2011    Past Surgical History:  Procedure Laterality Date  . None         Family History  Problem Relation Age of Onset  . Colon cancer Neg Hx     Social History   Tobacco Use  . Smoking status: Never Smoker  . Smokeless tobacco: Never Used  Substance Use Topics  . Alcohol use: No  . Drug use: No    Home Medications Prior to Admission medications   Medication Sig Start Date End Date Taking? Authorizing Provider  acetaminophen (TYLENOL) 325 MG tablet Take 650 mg by mouth as needed.   Yes [provider]  JANUMET XR [id number] MG TB24 Take 1 tablet by mouth daily. 03/21/19  Yes [provider]  levofloxacin (LEVAQUIN) 750 MG tablet Take 750 mg by mouth daily. 07/12/19  Yes [provider]  lisinopril-hydrochlorothiazide (ZESTORETIC) 10-12.5 MG tablet Take 1 tablet by mouth daily. 02/22/19  Yes [provider]  tamsulosin (FLOMAX) 0.4 MG CAPS capsule Take 0.4 mg by mouth at bedtime. 07/12/19  Yes [provider]  cyclobenzaprine (FLEXERIL) 10 MG tablet Take 1 tablet (10 mg total) by mouth 2 (two) times daily as needed for muscle spasms. Patient not taking: Reported on 07/15/2019  06/19/17   Dartha Lodge, PA-C    Allergies    Patient has no known allergies.  Review of Systems   Review of Systems All systems reviewed and negative, other than as noted in HPI.  Physical Exam Updated Vital Signs BP (!) 122/91   Pulse (!) 110   Temp 99.3 F (37.4 C) (Oral)   Resp 18   Ht 6\' 2"  (1.88 m)   Wt 127 kg   SpO2 95%   BMI 35.95 kg/m   Physical Exam Vitals and nursing note reviewed.  Constitutional:      General: He is not in acute distress.    Appearance: He is well-developed.  HENT:     Head: Normocephalic and atraumatic.  Eyes:     General:        Right eye: No discharge.        Left eye: No discharge.     Conjunctiva/sclera: Conjunctivae normal.  Cardiovascular:     Rate and Rhythm: Normal rate and regular rhythm.     Heart sounds: Normal heart sounds. No murmur heard.  No friction rub. No gallop.   Pulmonary:     Effort: Pulmonary effort is normal. No respiratory distress.     Breath sounds: Normal breath sounds.  Abdominal:     General: There is no distension.     Palpations: Abdomen is soft.     Tenderness: There is  no abdominal tenderness.  Musculoskeletal:        General: No tenderness.     Cervical back: Neck supple.     Comments: Lower extremities symmetric as compared to each other. No calf tenderness. Negative Homan's. No palpable cords.   Skin:    General: Skin is warm and dry.  Neurological:     Mental Status: He is alert.  Psychiatric:        Behavior: Behavior normal.        Thought Content: Thought content normal.     ED Results / Procedures / Treatments   Labs (all labs ordered are listed, but only abnormal results are displayed) Labs Reviewed  URINALYSIS, ROUTINE W REFLEX MICROSCOPIC - Abnormal; Notable for the following components:      Result Value   APPearance HAZY (*)    Glucose, UA >=500 (*)    Hgb urine dipstick MODERATE (*)    Protein, ur 100 (*)    All other components within normal limits  CBC WITH  DIFFERENTIAL/PLATELET - Abnormal; Notable for the following components:   Hemoglobin 12.9 (*)    MCV 72.8 (*)    MCH 23.2 (*)    All other components within normal limits  COMPREHENSIVE METABOLIC PANEL - Abnormal; Notable for the following components:   Sodium 128 (*)    Chloride 90 (*)    Glucose, Bld 266 (*)    Calcium 8.5 (*)    Albumin 3.2 (*)    All other components within normal limits  CULTURE, BLOOD (ROUTINE X 2)  CULTURE, BLOOD (ROUTINE X 2)  URINE CULTURE  SARS CORONAVIRUS 2 BY RT PCR (HOSPITAL ORDER, PERFORMED IN [place] [facility])  LACTIC ACID, PLASMA    EKG None  Radiology DG Chest Portable 1 View  Result Date: 07/15/2019 CLINICAL DATA:  Fever, cough EXAM: PORTABLE CHEST 1 VIEW COMPARISON:  2019 FINDINGS: Low lung volumes. Mild elevation of the right hemidiaphragm. No consolidation or edema. No pleural effusion. Normal heart size. Mild degenerative changes at the left glenohumeral joint. IMPRESSION: No acute process in the chest. Electronically Signed   By: Guadlupe Spanish M.D.   On: 07/15/2019 12:33    Procedures Procedures (including critical care time)  Medications Ordered in ED Medications  sodium chloride 0.9 % bolus 1,000 mL (has no administration in time range)  benzonatate (TESSALON) capsule 100 mg (has no administration in time range)  acetaminophen (TYLENOL) tablet 650 mg (650 mg Oral Given 07/15/19 1239)    ED Course  I have reviewed the triage vital signs and the nursing notes.  Pertinent labs & imaging results that were available during my care of the patient were reviewed by me and considered in my medical decision making (see chart for details).    MDM Rules/Calculators/A&P                         63yM with fatigue and cough. COVID negative. CXR ok. O2 sats normal. No signs/symptoms of DVT. Doubt PE. UA ok but has been on levaquin. Mild hyponatremia noted. Given 1L of NS. Neuro exam nonfocal. Needs repeat BMP next week. May need to be taken  off of HCTZ.  Final Clinical Impression(s) / ED Diagnoses Final diagnoses:  Viral URI with cough    Rx / DC Orders ED Discharge Orders    None       Raeford Razor, MD 07/16/19 [phone]

## 2019-07-15 NOTE — ED Triage Notes (Signed)
Pt to er, pt states that he has a cough, states that the cough started last Friday or Saturday, states that he has general body aches, states that when he has the cough it makes him sweat, states that he went to his pmd and was told that he had a kidney infection and given an abx.  States that he feels no better so he was told to come to the er.

## 2019-07-15 NOTE — ED Notes (Signed)
Pt given a drink per MD approval.  

## 2019-07-16 LAB — URINE CULTURE: Culture: NO GROWTH

## 2019-07-20 LAB — CULTURE, BLOOD (ROUTINE X 2)
Culture: NO GROWTH
Culture: NO GROWTH
Special Requests: ADEQUATE
Special Requests: ADEQUATE
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
## 2020-01-25 DIAGNOSIS — R319 Hematuria, unspecified: Secondary | ICD-10-CM | POA: Diagnosis not present

## 2020-01-25 DIAGNOSIS — G603 Idiopathic progressive neuropathy: Secondary | ICD-10-CM | POA: Diagnosis not present

## 2021-02-15 ENCOUNTER — Encounter: Payer: Self-pay | Admitting: Internal Medicine

## 2021-04-23 ENCOUNTER — Ambulatory Visit (INDEPENDENT_AMBULATORY_CARE_PROVIDER_SITE_OTHER): Payer: Self-pay | Admitting: *Deleted

## 2021-04-23 VITALS — Ht 74.0 in | Wt 263.0 lb

## 2021-04-23 DIAGNOSIS — Z1211 Encounter for screening for malignant neoplasm of colon: Secondary | ICD-10-CM

## 2021-04-23 NOTE — Progress Notes (Signed)
Gastroenterology Pre-Procedure Review ? ?Request Date: 04/23/2021 ?Requesting Physician: Lupita Raider, DNP-NP, no previous TCS ? ?PATIENT REVIEW QUESTIONS: The patient responded to the following health history questions as indicated:   ? ?1. Diabetes Melitis: yes, type II  ?2. Joint replacements in the past 12 months: no ?3. Major health problems in the past 3 months: no ?4. Has an artificial valve or MVP: no ?5. Has a defibrillator: no ?6. Has been advised in past to take antibiotics in advance of a procedure like teeth cleaning: no ?7. Family history of colon cancer: no  ?8. Alcohol Use: no ?9. Illicit drug Use: no ?10. History of sleep apnea: no  ?11. History of coronary artery or other vascular stents placed within the last 12 months: no ?12. History of any prior anesthesia complications: no ?13. Body mass index is 33.77 kg/m?. ?   ?MEDICATIONS & ALLERGIES:    ?Patient reports the following regarding taking any blood thinners:   ?Plavix?  no ?Aspirin?  no ?Coumadin? no ?Brilinta? no ?Xarelto? no ?Eliquis? no ?Pradaxa? no ?Savaysa? no ?Effient? no ? ?Patient confirms/reports the following medications:  ?Current Outpatient Medications  ?Medication Sig Dispense Refill  ? Empagliflozin-metFORMIN HCl ER (SYNJARDY XR) 10-998 MG TB24 daily at 6 (six) AM.    ? lisinopril-hydrochlorothiazide (ZESTORETIC) 10-12.5 MG tablet Take 1 tablet by mouth daily.    ? RYBELSUS 7 MG TABS Take 1 tablet by mouth daily.    ? ?No current facility-administered medications for this visit.  ? ? ?Patient confirms/reports the following allergies:  ?No Known Allergies ? ?No orders of the defined types were placed in this encounter. ? ? ?AUTHORIZATION INFORMATION ?Primary Insurance: [place],  [place] #: [id number],  Group #: [id number] ?Pre-Cert / Berkley Harvey required: No, not required ? ?SCHEDULE INFORMATION: ?Procedure has been scheduled as follows:  ?Date: , Time:   ?Location: [facility] with Dr. Marletta Lor ? ?This Gastroenterology Pre-Precedure Review Form is  being routed to the following provider(s): Tana Coast, PA-C ?  ?

## 2021-05-03 NOTE — Progress Notes (Addendum)
Ok to schedule. ASA 2.  ?Day of prep: synjardy 1/2 dose. ?AM of tcs: hold synjardy.  ?Hold rybelsus one week before tcs. ?Needs basic met panel. ?

## 2021-05-06 NOTE — Progress Notes (Signed)
Pt requested June procedure.  Will call him once June procedure schedules are available. ?

## 2021-05-10 NOTE — Progress Notes (Signed)
Spoke to pt.  Offered to schedule his procedure for June.  He informed me that he would need to call me back on Monday during his lunch hour.  He was at work today and could not talk. ?

## 2021-05-13 ENCOUNTER — Encounter: Payer: Self-pay | Admitting: *Deleted

## 2021-05-13 NOTE — Progress Notes (Signed)
Voice mail full and could not accept any messages.  Mailed letter out to pt. ?

## 2021-12-16 ENCOUNTER — Other Ambulatory Visit: Payer: Self-pay

## 2021-12-16 ENCOUNTER — Encounter (HOSPITAL_COMMUNITY): Payer: Self-pay | Admitting: *Deleted

## 2021-12-16 ENCOUNTER — Emergency Department (HOSPITAL_COMMUNITY): Payer: 59

## 2021-12-16 ENCOUNTER — Emergency Department (HOSPITAL_COMMUNITY)
Admission: EM | Admit: 2021-12-16 | Discharge: 2021-12-16 | Disposition: A | Discharge status: Home / Self Care | Payer: 59 | Attending: Emergency Medicine | Admitting: Emergency Medicine

## 2021-12-16 DIAGNOSIS — Z7984 Long term (current) use of oral hypoglycemic drugs: Secondary | ICD-10-CM | POA: Insufficient documentation

## 2021-12-16 DIAGNOSIS — Z79899 Other long term (current) drug therapy: Secondary | ICD-10-CM | POA: Insufficient documentation

## 2021-12-16 DIAGNOSIS — Z1152 Encounter for screening for COVID-19: Secondary | ICD-10-CM | POA: Insufficient documentation

## 2021-12-16 DIAGNOSIS — J101 Influenza due to other identified influenza virus with other respiratory manifestations: Secondary | ICD-10-CM | POA: Insufficient documentation

## 2021-12-16 DIAGNOSIS — I10 Essential (primary) hypertension: Secondary | ICD-10-CM | POA: Diagnosis not present

## 2021-12-16 DIAGNOSIS — J111 Influenza due to unidentified influenza virus with other respiratory manifestations: Secondary | ICD-10-CM

## 2021-12-16 DIAGNOSIS — R059 Cough, unspecified: Secondary | ICD-10-CM | POA: Diagnosis present

## 2021-12-16 LAB — RESP PANEL BY RT-PCR (RSV, FLU A&B, COVID)  RVPGX2
Influenza A by PCR: NEGATIVE
Influenza B by PCR: POSITIVE — AB
Resp Syncytial Virus by PCR: NEGATIVE
SARS Coronavirus 2 by RT PCR: NEGATIVE

## 2021-12-16 MED ORDER — ALBUTEROL SULFATE HFA 108 (90 BASE) MCG/ACT IN AERS
2.0000 | INHALATION_SPRAY | Freq: Once | RESPIRATORY_TRACT | Status: AC
  Administered 2021-12-16: 2 via RESPIRATORY_TRACT
  Filled 2021-12-16: qty 6.7

## 2021-12-16 MED ORDER — IBUPROFEN 800 MG PO TABS: 800.0000 mg | ORAL_TABLET | Freq: Three times a day (TID) | ORAL | 0 refills | Status: AC | PRN

## 2021-12-16 NOTE — ED Triage Notes (Signed)
Pt c/o body aches with chills and cough x 2 days

## 2021-12-16 NOTE — ED Provider Notes (Signed)
[facility] EMERGENCY DEPARTMENT Provider Note   CSN: [id number] Arrival date & time: 12/16/21  [id number]     History {Add pertinent medical, surgical, social history, OB history to HPI:1} Chief Complaint  Patient presents with   Generalized Body Aches    Melvin Carr is a 66 y.o. male.  Patient complains of fevers and a cough since Friday.  Past medical history hypertension   Fever      Home Medications Prior to Admission medications   Medication Sig Start Date End Date Taking? Authorizing Provider  ibuprofen (ADVIL) 800 MG tablet Take 1 tablet (800 mg total) by mouth every 8 (eight) hours as needed for moderate pain or fever. 12/16/21  Yes Milton Ferguson, MD  Empagliflozin-metFORMIN HCl ER (SYNJARDY XR) 10-998 MG TB24 daily at 6 (six) AM.    [provider]  lisinopril-hydrochlorothiazide (ZESTORETIC) 10-12.5 MG tablet Take 1 tablet by mouth daily. 02/22/19   [provider]  RYBELSUS 7 MG TABS Take 1 tablet by mouth daily. 03/25/21   [provider]      Allergies    Patient has no known allergies.    Review of Systems   Review of Systems  Constitutional:  Positive for fever.    Physical Exam Updated Vital Signs BP (!) 140/95   Pulse (!) 103   Temp (!) 97.4 F (36.3 C) (Oral)   Resp 18   Ht 6\' 2"  (1.88 m)   Wt 120.2 kg   SpO2 93%   BMI 34.02 kg/m  Physical Exam  ED Results / Procedures / Treatments   Labs (all labs ordered are listed, but only abnormal results are displayed) Labs Reviewed  RESP PANEL BY RT-PCR (RSV, FLU A&B, COVID)  RVPGX2 - Abnormal; Notable for the following components:      Result Value   Influenza B by PCR POSITIVE (*)    All other components within normal limits    EKG None  Radiology DG Chest 2 View  Result Date: 12/16/2021 CLINICAL DATA:  Shortness of breath, cough and congestion EXAM: CHEST - 2 VIEW COMPARISON:  07/15/2019 FINDINGS: The heart size and mediastinal contours are within normal  limits. Both lungs are clear. The visualized skeletal structures are unremarkable. IMPRESSION: No active cardiopulmonary disease. Electronically Signed   By: Jerilynn Mages.  Shick M.D.   On: 12/16/2021 09:34    Procedures Procedures  {Document cardiac monitor, telemetry assessment procedure when appropriate:1}  Medications Ordered in ED Medications  albuterol (VENTOLIN HFA) 108 (90 Base) MCG/ACT inhaler 2 puff (2 puffs Inhalation Given 12/16/21 0920)    ED Course/ Medical Decision Making/ A&P                           Medical Decision Making Amount and/or Complexity of Data Reviewed Radiology: ordered.  Risk Prescription drug management.   Patient with influenza.  He was told to drink fluids and rest and is given Motrin  {Document critical care time when appropriate:1} {Document review of labs and clinical decision tools ie heart score, Chads2Vasc2 etc:1}  {Document your independent review of radiology images, and any outside records:1} {Document your discussion with family members, caretakers, and with consultants:1} {Document social determinants of health affecting pt's care:1} {Document your decision making why or why not admission, treatments were needed:1} Final Clinical Impression(s) / ED Diagnoses Final diagnoses:  Influenza    Rx / DC Orders ED Discharge Orders          Ordered  ibuprofen (ADVIL) 800 MG tablet  Every 8 hours PRN        12/16/21 1035

## 2023-03-17 ENCOUNTER — Other Ambulatory Visit: Payer: Self-pay

## 2023-03-17 ENCOUNTER — Emergency Department (HOSPITAL_COMMUNITY)
Admission: EM | Admit: 2023-03-17 | Discharge: 2023-03-17 | Disposition: A | Discharge status: Home / Self Care | Attending: Emergency Medicine | Admitting: Emergency Medicine

## 2023-03-17 DIAGNOSIS — Z23 Encounter for immunization: Secondary | ICD-10-CM | POA: Diagnosis not present

## 2023-03-17 DIAGNOSIS — X19XXXA Contact with other heat and hot substances, initial encounter: Secondary | ICD-10-CM | POA: Insufficient documentation

## 2023-03-17 DIAGNOSIS — T22211A Burn of second degree of right forearm, initial encounter: Secondary | ICD-10-CM | POA: Diagnosis present

## 2023-03-17 MED ORDER — BACITRACIN ZINC 500 UNIT/GM EX OINT: 1.0000 | TOPICAL_OINTMENT | Freq: Two times a day (BID) | CUTANEOUS | 0 refills | Status: AC

## 2023-03-17 MED ORDER — TETANUS-DIPHTH-ACELL PERTUSSIS 5-2.5-18.5 LF-MCG/0.5 IM SUSY
0.5000 mL | PREFILLED_SYRINGE | Freq: Once | INTRAMUSCULAR | Status: AC
  Administered 2023-03-17: 0.5 mL via INTRAMUSCULAR
  Filled 2023-03-17: qty 0.5

## 2023-03-17 MED ORDER — BACITRACIN ZINC 500 UNIT/GM EX OINT
TOPICAL_OINTMENT | Freq: Once | CUTANEOUS | Status: AC
  Administered 2023-03-17: 5 via TOPICAL
  Filled 2023-03-17: qty 0.9

## 2023-03-17 NOTE — Discharge Instructions (Signed)
 Please continue good wound care as discussed at the bedside.  Please continue to place the ointment over the affected site.  Follow-up closely with your primary care doctor on an outpatient basis for reevaluation.  Return to the emergency department immediately for any new or worsening symptoms or if any signs of infection are noticed.
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
## 2023-03-17 NOTE — ED Provider Notes (Signed)
 [place] EMERGENCY DEPARTMENT AT [facility] Provider Note   CSN: [id number] Arrival date & time: 03/17/23  2016     History  Chief Complaint  Patient presents with   Burn    Melvin Carr is a 68 y.o. male.  Patient is a 68 year old male who presents to the emergency department with a chief complaint of a burn to the dorsal aspect of his right forearm which occurred 4 days ago.  Patient notes that he burned the arm while working on a car.  Patient is unsure of his last tetanus shot.  He notes that he only has minimal pain over the affected area.  He denies any numbness or paresthesias distally.  He denies any other secondary sites of injury.   Burn      Home Medications Prior to Admission medications   Medication Sig Start Date End Date Taking? Authorizing Provider  Empagliflozin-metFORMIN HCl ER (SYNJARDY XR) 10-998 MG TB24 daily at 6 (six) AM.    [provider]  ibuprofen (ADVIL) 800 MG tablet Take 1 tablet (800 mg total) by mouth every 8 (eight) hours as needed for moderate pain or fever. 12/16/21   Bethann Berkshire, MD  lisinopril-hydrochlorothiazide (ZESTORETIC) 10-12.5 MG tablet Take 1 tablet by mouth daily. 02/22/19   [provider]  RYBELSUS 7 MG TABS Take 1 tablet by mouth daily. 03/25/21   [provider]      Allergies    Patient has no known allergies.    Review of Systems   Review of Systems  Skin:        Burn to right arm  All other systems reviewed and are negative.   Physical Exam Updated Vital Signs BP (!) 145/92 (BP Location: Left Arm)   Pulse (!) 103   Temp 98.3 F (36.8 C) (Oral)   Resp 17   Wt 120.2 kg   SpO2 97%   BMI 34.02 kg/m  Physical Exam Vitals and nursing note reviewed.  Constitutional:      Appearance: Normal appearance.  HENT:     Head: Normocephalic and atraumatic.     Nose: Nose normal.     Mouth/Throat:     Mouth: Mucous membranes are moist.  Eyes:     Extraocular Movements:  Extraocular movements intact.     Conjunctiva/sclera: Conjunctivae normal.     Pupils: Pupils are equal, round, and reactive to light.  Cardiovascular:     Rate and Rhythm: Normal rate and regular rhythm.     Pulses: Normal pulses.     Heart sounds: Normal heart sounds.  Pulmonary:     Effort: Pulmonary effort is normal. No respiratory distress.     Breath sounds: Normal breath sounds. No wheezing or rales.  Musculoskeletal:        General: Normal range of motion.     Cervical back: Normal range of motion and neck supple.     Comments: No bony tenderness noted throughout, sensation intact bilateral extremities, radial pulse 2+ upper extremities, full range of motion noted throughout, no deformity  Skin:    General: Skin is warm and dry.     Comments: Second-degree burn noted over the dorsal aspect of the right forearm, no circumferential injury, no surrounding erythema or cellulitic changes, no purulent discharge, no areas of induration or fluctuance  Neurological:     General: No focal deficit present.     Mental Status: He is alert and oriented to person, place, and time. Mental status is  at baseline.  Psychiatric:        Mood and Affect: Mood normal.        Behavior: Behavior normal.        Thought Content: Thought content normal.        Judgment: Judgment normal.     ED Results / Procedures / Treatments   Labs (all labs ordered are listed, but only abnormal results are displayed) Labs Reviewed - No data to display  EKG None  Radiology No results found.  Procedures Procedures    Medications Ordered in ED Medications  Tdap (BOOSTRIX) injection 0.5 mL (has no administration in time range)  bacitracin ointment (has no administration in time range)    ED Course/ Medical Decision Making/ A&P                                 Medical Decision Making Risk OTC drugs. Prescription drug management.   This patient presents to the ED for concern of burn differential  diagnosis includes first-degree, second-degree, third-degree burn, compartment syndrome, cellulitis, abscess remission    Additional history obtained:  Additional history obtained from none External records from outside source obtained and reviewed including none   Medicines ordered and prescription drug management:  I ordered medication including Tdap, bacitracin for secondary burn Reevaluation of the patient after these medicines showed that the patient improved I have reviewed the patients home medicines and have made adjustments as needed   Problem List / ED Course:  Patient is doing well at this time and is stable for discharge home.  Patient does have findings consistent with a secondary burn to the dorsal aspect of the right forearm.  Patient has no indication for circumferential injury at this point.  He is neurovascularly intact distally.  He has no changes to suggest cellulitis or abscess formation.  Tetanus shot was updated in the emergency department.  Will continue bacitracin ointment on an outpatient basis.  Did discuss continue good wound care as well as the need to keep the blisters intact as he has been popping them at home.  Patient has no indication for compartment syndrome.  Strict return precautions were discussed for any new or worsening symptoms.  Patient voiced understanding had no additional questions.   Social Determinants of Health:  None           Final Clinical Impression(s) / ED Diagnoses Final diagnoses:  None    Rx / DC Orders ED Discharge Orders     None         Kathlen Mody 03/17/23 2216    Derwood Kaplan, MD 03/21/23 [phone]

## 2023-09-09 ENCOUNTER — Other Ambulatory Visit
